# Patient Record
Sex: Male | Born: 1985 | Race: Black or African American | Hispanic: No | Marital: Single | Smoking: Never smoker
Health system: Southern US, Community
[De-identification: ages and names within clinical notes are randomized; demographics above are authoritative.]

---

## 2015-08-09 ENCOUNTER — Encounter (HOSPITAL_COMMUNITY): Payer: Self-pay

## 2015-08-09 ENCOUNTER — Emergency Department (HOSPITAL_COMMUNITY)
Admission: EM | Admit: 2015-08-09 | Discharge: 2015-08-09 | Disposition: A | Discharge status: Home / Self Care | Payer: Self-pay | Attending: Emergency Medicine | Admitting: Emergency Medicine

## 2015-08-09 ENCOUNTER — Emergency Department (HOSPITAL_COMMUNITY): Payer: Self-pay

## 2015-08-09 DIAGNOSIS — H9209 Otalgia, unspecified ear: Secondary | ICD-10-CM | POA: Insufficient documentation

## 2015-08-09 DIAGNOSIS — B349 Viral infection, unspecified: Secondary | ICD-10-CM | POA: Insufficient documentation

## 2015-08-09 NOTE — ED Notes (Addendum)
Pt. Developed 2 days ago chills, fever body aches.  Pt. Also has a mild sore throat.  Denies any n/v/d has an occassional cough.  Pt. Denies any pain at present time. He states, "It is worse at night."

## 2015-08-09 NOTE — ED Provider Notes (Signed)
CSN: [id number]     Arrival date & time 08/09/15  1343 History   By signing my name below, I, Linus GalasMaharshi Patel, attest that this documentation has been prepared under the direction and in the presence of Audry Piliyler Sheyla Zaffino, PA-C. Electronically Signed: Linus GalasMaharshi Patel, ED Scribe. 08/09/2015. 2:20 PM.   Chief Complaint  Patient presents with  . Influenza   The history is provided by the patient. No language interpreter was used.   HPI Comments: Charles Yu is a 30 y.o. male who presents to the Emergency Department with no pertinent PMHx complaining of flu-like symptoms that began 2 days ago but worsened last night. Pt reports subjective fever, chills, body aches, sore throat, HA, nausea, decreased appetite, mild congestion, mild productive cough, and mild ear pain. Pt rates his symptoms a 3-4/10 in severity. Pt denies any OTC medications for his symptoms. Pt denies any rhinorrhea, CP, abdominal pain, or any other symptoms at this time.   History reviewed. No pertinent past medical history. History reviewed. No pertinent past surgical history. No family history on file. Social History  Substance Use Topics  . Smoking status: Never Smoker   . Smokeless tobacco: None  . Alcohol Use: No    Review of Systems A complete 10 system review of systems was obtained and all systems are negative except as noted in the HPI and PMH.   Allergies  Review of patient's allergies indicates not on file.  Home Medications   Prior to Admission medications   Not on File   BP 127/86 mmHg  Pulse 98  Temp(Src) 99.6 F (37.6 C) (Oral)  Resp 18  Ht 5\' 8"  (1.727 m)  Wt 169 lb 9 oz (76.913 kg)  BMI 25.79 kg/m2  SpO2 100%   Physical Exam  Constitutional: He is oriented to person, place, and time. He appears well-developed and well-nourished. No distress.  HENT:  Head: Normocephalic and atraumatic.  Right Ear: Tympanic membrane, external ear and ear canal normal.  Left Ear: Tympanic membrane, external ear and  ear canal normal.  Nose: Nose normal.  Mouth/Throat: Uvula is midline, oropharynx is clear and moist and mucous membranes are normal. No trismus in the jaw. No oropharyngeal exudate, posterior oropharyngeal erythema or tonsillar abscesses.  Eyes: EOM are normal. Pupils are equal, round, and reactive to light.  Neck: Normal range of motion. Neck supple. No tracheal deviation present.  Cardiovascular: Normal rate, regular rhythm, S1 normal, S2 normal, normal heart sounds, intact distal pulses and normal pulses.   Pulmonary/Chest: Effort normal and breath sounds normal. No respiratory distress. He has no decreased breath sounds. He has no wheezes. He has no rhonchi. He has no rales.  Abdominal: Normal appearance and bowel sounds are normal. He exhibits no distension. There is no tenderness.  Musculoskeletal: Normal range of motion.  Neurological: He is alert and oriented to person, place, and time.  Skin: Skin is warm and dry.  Psychiatric: He has a normal mood and affect. His speech is normal and behavior is normal. Thought content normal.  Nursing note and vitals reviewed.  ED Course  Procedures   DIAGNOSTIC STUDIES: Oxygen Saturation is 100% on room air, normal by my interpretation.    COORDINATION OF CARE: 2:15 PM Will order CXR Discussed treatment plan with pt at bedside and pt agreed to plan.  Labs Review Labs Reviewed - No data to display  Imaging Review Dg Chest 2 View  08/09/2015  CLINICAL DATA:  Productive cough and sore throat EXAM: CHEST  2  VIEW COMPARISON:  None. FINDINGS: The heart size and mediastinal contours are within normal limits. Both lungs are clear. The visualized skeletal structures are unremarkable. IMPRESSION: No active cardiopulmonary disease. Electronically Signed   By: Alcide Clever M.D.   On: 08/09/2015 14:43   I have personally reviewed and evaluated these images and lab results as part of my medical decision-making.   EKG Interpretation None      MDM   I have reviewed and evaluated the relevant imaging studies. I have reviewed the relevant previous healthcare records. I obtained HPI from historian.  ED Course:  Assessment: Pt is a 30yM presents with URI symptoms x2 days . On exam, pt in NAD. VSS. Afebrile. Lungs CTA, Heart RRR. Abdomen nontender/soft. Pt CXR negative for acute infiltrate. Patients symptoms are consistent with URI, likely viral etiology. Discussed that antibiotics are not indicated for viral infections. Pt will be discharged with symptomatic treatment.  Verbalizes understanding and is agreeable with plan. Pt is hemodynamically stable & in NAD prior to dc.  Disposition/Plan:  DC home Additional Verbal discharge instructions given and discussed with patient.  Pt Instructed to f/u with PCP in the next week for evaluation and treatment of symptoms. Return precautions given Pt acknowledges and agrees with plan  Supervising Physician Vanetta Mulders, MD   Final diagnoses:  Viral syndrome    I personally performed the services described in this documentation, which was scribed in my presence. The recorded information has been reviewed and is accurate.   Audry Pili, PA-C 08/09/15 1446  Audry Pili, PA-C 08/09/15 1454  Vanetta Mulders, MD 08/14/15 [phone]

## 2015-08-09 NOTE — Discharge Instructions (Signed)
Please read and follow all provided instructions.  Your diagnoses today include:  1. Viral syndrome    You appear to have an upper respiratory infection (URI). An upper respiratory tract infection, or cold, is a viral infection of the air passages leading to the lungs. It should improve gradually after 5-7 days. You may have a lingering cough that lasts for 2- 4 weeks after the infection.  Tests performed today include:  Vital signs. See below for your results today.   Medications prescribed:   Take any prescribed medications only as directed. Treatment for your infection is aimed at treating the symptoms. There are no medications, such as antibiotics, that will cure your infection.   Home care instructions:  Follow any educational materials contained in this packet.   Your illness is contagious and can be spread to others, especially during the first 3 or 4 days. It cannot be cured by antibiotics or other medicines. Take basic precautions such as washing your hands often, covering your mouth when you cough or sneeze, and avoiding public places where you could spread your illness to others.   Please continue drinking plenty of fluids.  Use over-the-counter medicines as needed as directed on packaging for symptom relief.  You may also use ibuprofen or tylenol as directed on packaging for pain or fever.  Do not take multiple medicines containing Tylenol or acetaminophen to avoid taking too much of this medication.  Follow-up instructions: Please follow-up with your primary care provider in the next 3 days for further evaluation of your symptoms if you are not feeling better.   Return instructions:   Please return to the Emergency Department if you experience worsening symptoms.   RETURN IMMEDIATELY IF you develop shortness of breath, confusion or altered mental status, a new rash, become dizzy, faint, or poorly responsive, or are unable to be cared for at home.  Please return if you have  persistent vomiting and cannot keep down fluids or develop a fever that is not controlled by tylenol or motrin.    Please return if you have any other emergent concerns.  Additional Information:  Your vital signs today were: BP 127/86 mmHg   Pulse 98   Temp(Src) 99.6 F (37.6 C) (Oral)   Resp 18   Ht 5\' 8"  (1.727 m)   Wt 76.913 kg   BMI 25.79 kg/m2   SpO2 100% If your blood pressure (BP) was elevated above 135/85 this visit, please have this repeated by your doctor within one month. --------------

## 2015-08-09 NOTE — ED Notes (Signed)
C/o cough, body aches, chills-onset 3 days ago.

## 2017-08-08 IMAGING — DX DG CHEST 2V
2 series · 2 of 2 positions shown · non-contrast
Comparison: None.

CLINICAL DATA: Productive cough and sore throat

EXAM:
CHEST  2 VIEW

[chest pa]
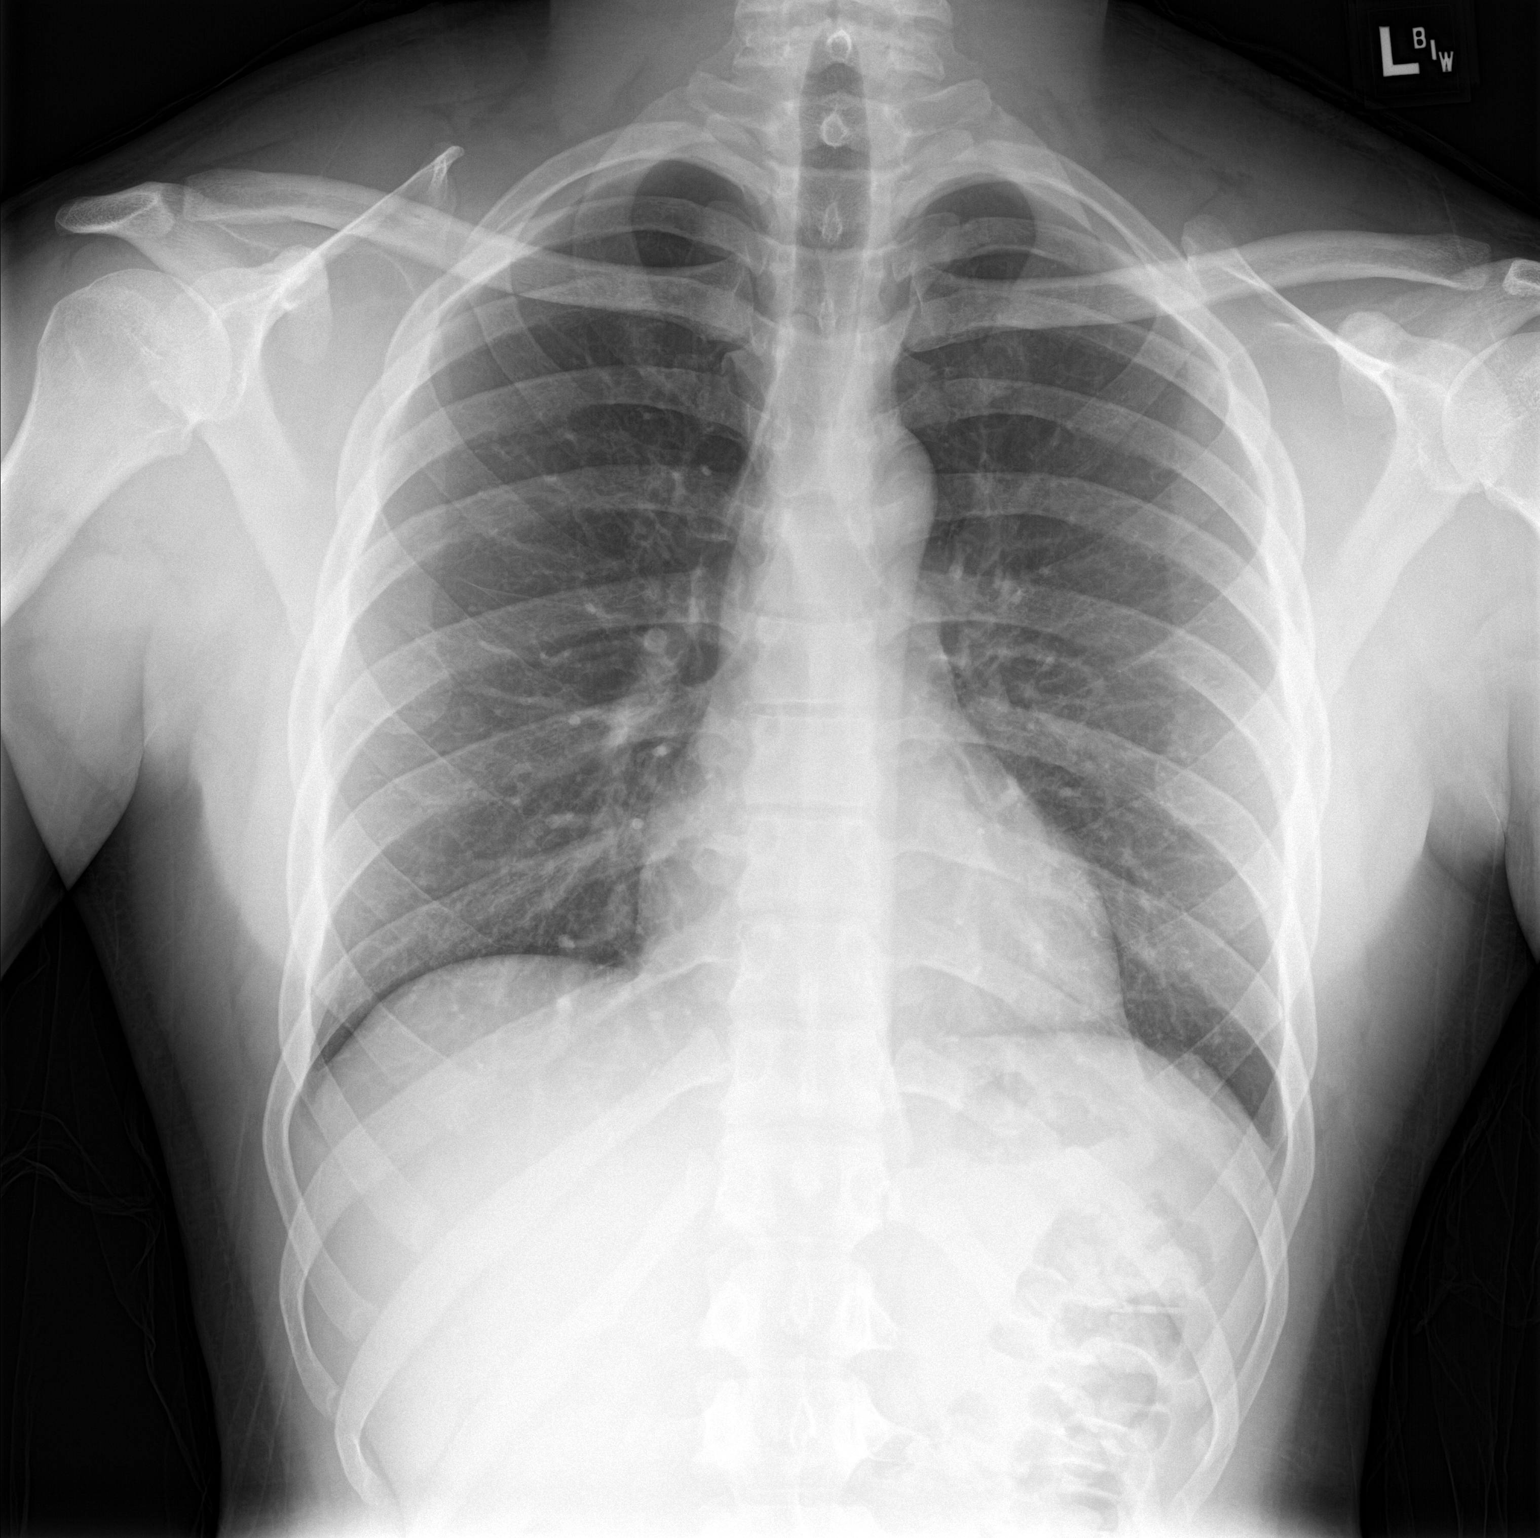

[chest lat]
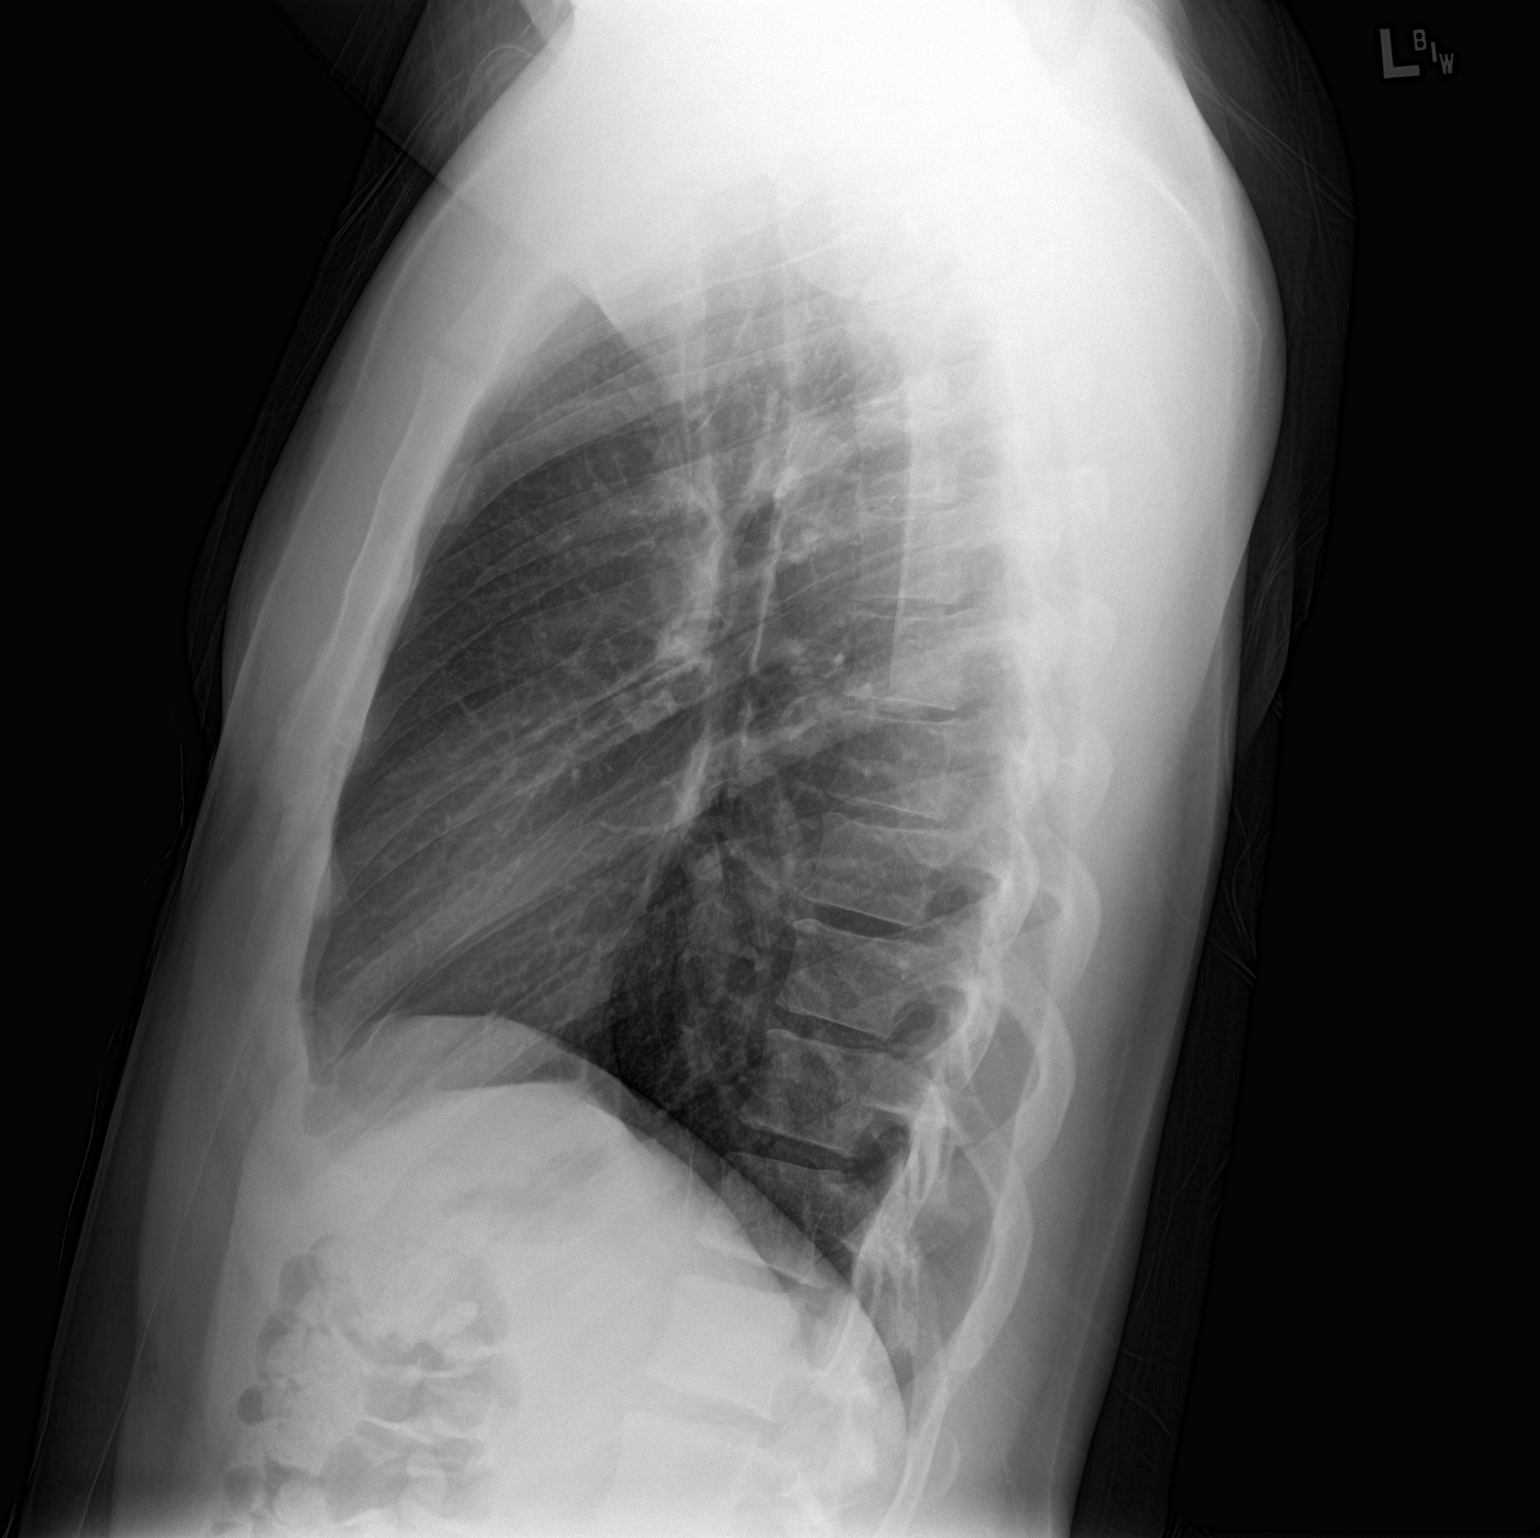

[2 of 2 positions shown; findings below may reference images not displayed]

FINDINGS: The heart size and mediastinal contours are within normal limits.
Both lungs are clear. The visualized skeletal structures are
unremarkable.
IMPRESSION: No active cardiopulmonary disease.
